# Patient Record
Sex: Female | Born: 2000 | Race: White | Hispanic: No | Marital: Single | Smoking: Never smoker
Health system: Southern US, Community
[De-identification: ages and names within clinical notes are randomized; demographics above are authoritative.]

## PROBLEM LIST (undated history)

## (undated) DIAGNOSIS — A749 Chlamydial infection, unspecified: Secondary | ICD-10-CM

## (undated) HISTORY — PX: TONSILLECTOMY: SUR1361

## (undated) HISTORY — DX: Chlamydial infection, unspecified: A74.9

## (undated) HISTORY — PX: NO PAST SURGERIES: SHX2092

---
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
## 2018-02-05 ENCOUNTER — Ambulatory Visit: Payer: Medicaid Other

## 2018-03-13 ENCOUNTER — Other Ambulatory Visit: Payer: Self-pay | Admitting: Obstetrics and Gynecology

## 2018-03-13 DIAGNOSIS — Z30011 Encounter for initial prescription of contraceptive pills: Secondary | ICD-10-CM

## 2018-03-14 DIAGNOSIS — A749 Chlamydial infection, unspecified: Secondary | ICD-10-CM

## 2018-03-14 HISTORY — DX: Chlamydial infection, unspecified: A74.9

## 2018-03-19 ENCOUNTER — Encounter: Payer: Self-pay | Admitting: Obstetrics and Gynecology

## 2018-03-19 ENCOUNTER — Ambulatory Visit (INDEPENDENT_AMBULATORY_CARE_PROVIDER_SITE_OTHER): Payer: Medicaid Other | Admitting: Obstetrics and Gynecology

## 2018-03-19 VITALS — BP 110/64 | HR 88 | Ht 63.0 in | Wt 112.0 lb

## 2018-03-19 DIAGNOSIS — B373 Candidiasis of vulva and vagina: Secondary | ICD-10-CM

## 2018-03-19 DIAGNOSIS — Z113 Encounter for screening for infections with a predominantly sexual mode of transmission: Secondary | ICD-10-CM

## 2018-03-19 DIAGNOSIS — B3731 Acute candidiasis of vulva and vagina: Secondary | ICD-10-CM

## 2018-03-19 LAB — POCT WET PREP WITH KOH
Clue Cells Wet Prep HPF POC: NEGATIVE
KOH Prep POC: NEGATIVE
Trichomonas, UA: NEGATIVE
YEAST WET PREP PER HPF POC: POSITIVE

## 2018-03-19 MED ORDER — TERCONAZOLE 0.8 % VA CREA: 1.0000 | TOPICAL_CREAM | Freq: Every day | VAGINAL | 0 refills | Status: AC

## 2018-03-19 NOTE — Progress Notes (Signed)
Stefanie Pinto, MD   Chief Complaint  Patient presents with  . Vaginitis    Itching x 1 month     HPI:      Stefanie Boone is a 17 y.o. G0P0000 who LMP was No LMP recorded. Patient has had an injection., presents today for vaginal itching without increased d/c, odor for the past month. Had "similar but different" sx 1/19 and was given diflucan but never took it then. Took it about 1 mo ago with vag itch sx without relief. No recent abx use. No LBP, belly pain, fevers. She is sex active with new partner. Uses olay soap, no dryer sheets, wears thongs.    History reviewed. No pertinent past medical history.  Past Surgical History:  Procedure Laterality Date  . NO PAST SURGERIES      Family History  Problem Relation Age of Onset  . Cervical cancer Mother   . Bladder Cancer Maternal Aunt   . Breast cancer Maternal Grandmother 71       pt's mom is BRCA neg 2012  . Bone cancer Maternal Grandmother   . Colon cancer Maternal Grandmother        great grandma    Social History   Socioeconomic History  . Marital status: Single    Spouse name: Not on file  . Number of children: Not on file  . Years of education: Not on file  . Highest education level: Not on file  Occupational History  . Not on file  Social Needs  . Financial resource strain: Not on file  . Food insecurity:    Worry: Not on file    Inability: Not on file  . Transportation needs:    Medical: Not on file    Non-medical: Not on file  Tobacco Use  . Smoking status: Never Smoker  . Smokeless tobacco: Never Used  Substance and Sexual Activity  . Alcohol use: No  . Drug use: No  . Sexual activity: Yes    Birth control/protection: Injection  Lifestyle  . Physical activity:    Days per week: Not on file    Minutes per session: Not on file  . Stress: Not on file  Relationships  . Social connections:    Talks on phone: Not on file    Gets together: Not on file    Attends religious service: Not on  file    Active member of club or organization: Not on file    Attends meetings of clubs or organizations: Not on file    Relationship status: Not on file  . Intimate partner violence:    Fear of current or ex partner: Not on file    Emotionally abused: Not on file    Physically abused: Not on file    Forced sexual activity: Not on file  Other Topics Concern  . Not on file  Social History Narrative  . Not on file    Outpatient Medications Prior to Visit  Medication Sig Dispense Refill  . BLISOVI 24 FE 1-20 MG-MCG(24) tablet TAKE 1 TABLET BY MOUTH EVERY DAY 84 tablet 0   No facility-administered medications prior to visit.       ROS:  Review of Systems  Constitutional: Negative for fever.  Gastrointestinal: Negative for blood in stool, constipation, diarrhea, nausea and vomiting.  Genitourinary: Negative for dyspareunia, dysuria, flank pain, frequency, hematuria, urgency, vaginal bleeding, vaginal discharge and vaginal pain.  Musculoskeletal: Negative for back pain.  Skin: Negative for rash.  OBJECTIVE:   Vitals:  BP 110/64   Pulse 88   Ht '5\' 3"'  (1.6 m)   Wt 112 lb (50.8 kg)   BMI 19.84 kg/m   Physical Exam  Constitutional: She is oriented to person, place, and time. Vital signs are normal. She appears well-developed.  Pulmonary/Chest: Effort normal.  Genitourinary: Vagina normal and uterus normal. There is tenderness on the right labia. There is no rash or lesion on the right labia. There is tenderness on the left labia. There is no rash or lesion on the left labia. Uterus is not enlarged and not tender. Cervix exhibits no motion tenderness. Right adnexum displays no mass and no tenderness. Left adnexum displays no mass and no tenderness. No erythema or tenderness in the vagina. No vaginal discharge found.  Musculoskeletal: Normal range of motion.  Neurological: She is alert and oriented to person, place, and time.  Psychiatric: She has a normal mood and affect. Her  behavior is normal. Thought content normal.  Vitals reviewed.   Results: Results for orders placed or performed in visit on 03/19/18 (from the past 24 hour(s))  POCT Wet Prep with KOH     Status: Abnormal   Collection Time: 03/19/18  3:15 PM  Result Value Ref Range   Trichomonas, UA Negative    Clue Cells Wet Prep HPF POC neg    Epithelial Wet Prep HPF POC  Few, Moderate, Many, Too numerous to count   Yeast Wet Prep HPF POC pos    Bacteria Wet Prep HPF POC  Few   RBC Wet Prep HPF POC     WBC Wet Prep HPF POC     KOH Prep POC Negative Negative     Assessment/Plan: Candidal vaginitis - Pos wet prep. Rx terazol3. F/u prn. Dove sens skin/d/c thongs.  - Plan: POCT Wet Prep with KOH, terconazole (TERAZOL 3) 0.8 % vaginal cream  Screening for STD (sexually transmitted disease) - Plan: Chlamydia/Gonococcus/Trichomonas, NAA    Meds ordered this encounter  Medications  . terconazole (TERAZOL 3) 0.8 % vaginal cream    Sig: Place 1 applicator vaginally at bedtime for 3 days.    Dispense:  20 g    Refill:  0    Order Specific Question:   Supervising Provider    Answer:   Gae Dry [[id number]]      Return if symptoms worsen or fail to improve.  Raysa Bosak B. Jaxten Brosh, PA-C 03/19/2018 3:16 PM

## 2018-03-19 NOTE — Patient Instructions (Signed)
I value your feedback and entrusting us with your care. If you get a [facility] patient survey, I would appreciate you taking the time to let us know about your experience today. Thank you! 

## 2018-03-21 ENCOUNTER — Telehealth: Payer: Self-pay | Admitting: Obstetrics and Gynecology

## 2018-03-21 ENCOUNTER — Other Ambulatory Visit: Payer: Self-pay | Admitting: Obstetrics and Gynecology

## 2018-03-21 DIAGNOSIS — A5609 Other chlamydial infection of lower genitourinary tract: Secondary | ICD-10-CM

## 2018-03-21 LAB — CHLAMYDIA/GONOCOCCUS/TRICHOMONAS, NAA
Chlamydia by NAA: POSITIVE — AB
Gonococcus by NAA: NEGATIVE
Trich vag by NAA: NEGATIVE

## 2018-03-21 MED ORDER — AZITHROMYCIN 500 MG PO TABS: 1000.0000 mg | ORAL_TABLET | Freq: Once | ORAL | 0 refills | Status: AC

## 2018-03-21 NOTE — Progress Notes (Signed)
Rx azithro for chlamydia.  Treated 4 months ago but never had TOC. RTO 7/19 for TOC.

## 2018-03-21 NOTE — Telephone Encounter (Signed)
Pt aware of chlamydia. Rx azithro. Partner needs tx. RTO 7/19 for TOC. Had it 4 months ago and did treatment but never had TOC. Condoms. RN to notify [facility].

## 2018-04-27 ENCOUNTER — Encounter: Payer: Self-pay | Admitting: Obstetrics and Gynecology

## 2018-04-27 ENCOUNTER — Ambulatory Visit (INDEPENDENT_AMBULATORY_CARE_PROVIDER_SITE_OTHER): Payer: Medicaid Other | Admitting: Obstetrics and Gynecology

## 2018-04-27 ENCOUNTER — Other Ambulatory Visit (HOSPITAL_COMMUNITY)
Admission: RE | Admit: 2018-04-27 | Discharge: 2018-04-27 | Disposition: A | Discharge status: Home / Self Care | Payer: Medicaid Other | Source: Ambulatory Visit | Attending: Obstetrics and Gynecology | Admitting: Obstetrics and Gynecology

## 2018-04-27 VITALS — BP 100/62 | HR 85 | Ht 63.0 in | Wt 112.5 lb

## 2018-04-27 DIAGNOSIS — Z0000 Encounter for general adult medical examination without abnormal findings: Secondary | ICD-10-CM | POA: Diagnosis not present

## 2018-04-27 DIAGNOSIS — A5609 Other chlamydial infection of lower genitourinary tract: Secondary | ICD-10-CM | POA: Insufficient documentation

## 2018-04-27 DIAGNOSIS — Z3041 Encounter for surveillance of contraceptive pills: Secondary | ICD-10-CM

## 2018-04-27 DIAGNOSIS — Z113 Encounter for screening for infections with a predominantly sexual mode of transmission: Secondary | ICD-10-CM

## 2018-04-27 DIAGNOSIS — Z01419 Encounter for gynecological examination (general) (routine) without abnormal findings: Secondary | ICD-10-CM

## 2018-04-27 MED ORDER — NORETHIN ACE-ETH ESTRAD-FE 1-20 MG-MCG(24) PO TABS: 1.0000 | ORAL_TABLET | Freq: Every day | ORAL | 3 refills | Status: DC

## 2018-04-27 NOTE — Progress Notes (Signed)
Chief Complaint  Patient presents with  . Gynecologic Exam     HPI:      [name] is a 17 y.o. G0P0000 who LMP was Patient's last menstrual period was 04/25/2018 (exact date)., presents today for her annual examinationHer menses are monthly, last 4 days. Dysmenorrhea mild, improved with NSAIDs. She does not have intermenstrual bleeding. Switched from depo to OCPs a few months ago and doing well.   Sex activity: sexually active, using OCPs.  Hx of STDs: chlamydia 6/19, took azithro, partner treated. No sx. Due for TOC today.  There is a FH of breast cancer in her MGM. Pt's mom is BRCA neg ~2012. There is no FH of ovarian cancer.   Tobacco use: The patient denies current or previous tobacco use. Alcohol use: none Exercise: moderately active  She does not get adequate calcium and Vitamin D in her diet.  She had 1 gard vaccine but then mom heard so many bad stories, she didn't get the final 2. Mom is still considering them.   Past Medical History:  Diagnosis Date  . Chlamydia 03/2018    Past Surgical History:  Procedure Laterality Date  . NO PAST SURGERIES      Family History  Problem Relation Age of Onset  . Cervical cancer Mother   . Bladder Cancer Maternal Aunt   . Breast cancer Maternal Grandmother 29       pt's mom is BRCA neg 2012  . Bone cancer Maternal Grandmother   . Colon cancer Maternal Grandmother        great grandma    Social History   Socioeconomic History  . Marital status: Single    Spouse name: Not on file  . Number of children: Not on file  . Years of education: Not on file  . Highest education level: Not on file  Occupational History  . Not on file  Social Needs  . Financial resource strain: Not on file  . Food insecurity:    Worry: Not on file    Inability: Not on file  . Transportation needs:    Medical: Not on file    Non-medical: Not on file  Tobacco Use  . Smoking status: Never Smoker  . Smokeless tobacco:  Never Used  Substance and Sexual Activity  . Alcohol use: No  . Drug use: No  . Sexual activity: Not Currently    Birth control/protection: Pill  Lifestyle  . Physical activity:    Days per week: Not on file    Minutes per session: Not on file  . Stress: Not on file  Relationships  . Social connections:    Talks on phone: Not on file    Gets together: Not on file    Attends religious service: Not on file    Active member of club or organization: Not on file    Attends meetings of clubs or organizations: Not on file    Relationship status: Not on file  . Intimate partner violence:    Fear of current or ex partner: Not on file    Emotionally abused: Not on file    Physically abused: Not on file    Forced sexual activity: Not on file  Other Topics Concern  . Not on file  Social History Narrative  . Not on file    No current outpatient medications on file prior to visit.   No current facility-administered medications on file prior to visit.  ROS:  Review of Systems  Constitutional: Negative for fatigue, fever and unexpected weight change.  Respiratory: Negative for cough, shortness of breath and wheezing.   Cardiovascular: Negative for chest pain, palpitations and leg swelling.  Gastrointestinal: Negative for blood in stool, constipation, diarrhea, nausea and vomiting.  Endocrine: Negative for cold intolerance, heat intolerance and polyuria.  Genitourinary: Negative for dyspareunia, dysuria, flank pain, frequency, genital sores, hematuria, menstrual problem, pelvic pain, urgency, vaginal bleeding, vaginal discharge and vaginal pain.  Musculoskeletal: Negative for back pain, joint swelling and myalgias.  Skin: Negative for rash.  Neurological: Negative for dizziness, syncope, light-headedness, numbness and headaches.  Hematological: Negative for adenopathy.  Psychiatric/Behavioral: Negative for agitation, confusion, sleep disturbance and suicidal ideas. The patient is  not nervous/anxious.      Objective: BP 100/62   Pulse 85   Ht '5\' 3"'  (1.6 m)   Wt 112 lb 8 oz (51 kg)   LMP 04/25/2018 (Exact Date)   BMI 19.93 kg/m    Physical Exam  Constitutional: She is oriented to person, place, and time. She appears well-developed and well-nourished.  Genitourinary: Vagina normal and uterus normal. There is no rash or tenderness on the right labia. There is no rash or tenderness on the left labia. No erythema or tenderness in the vagina. No vaginal discharge found. Right adnexum does not display mass and does not display tenderness. Left adnexum does not display mass and does not display tenderness. Cervix does not exhibit motion tenderness or polyp. Uterus is not enlarged or tender.  Neck: Normal range of motion. No thyromegaly present.  Cardiovascular: Normal rate, regular rhythm and normal heart sounds.  No murmur heard. Pulmonary/Chest: Effort normal and breath sounds normal. Right breast exhibits no mass, no nipple discharge, no skin change and no tenderness. Left breast exhibits no mass, no nipple discharge, no skin change and no tenderness.  Abdominal: Soft. There is no tenderness. There is no guarding.  Musculoskeletal: Normal range of motion.  Neurological: She is alert and oriented to person, place, and time. No cranial nerve deficit.  Psychiatric: She has a normal mood and affect. Her behavior is normal.  Vitals reviewed.   Assessment/Plan: Encounter for annual routine gynecological examination  Screening for STD (sexually transmitted disease) - Plan: Cervicovaginal ancillary only  Chlamydial cervicitis - TOC today. Will call with results.  - Plan: Cervicovaginal ancillary only  Encounter for surveillance of contraceptive pills - OCP RF.  - Plan: Norethindrone Acetate-Ethinyl Estrad-FE (BLISOVI 24 FE) 1-20 MG-MCG(24) tablet  Meds ordered this encounter  Medications  . Norethindrone Acetate-Ethinyl Estrad-FE (BLISOVI 24 FE) 1-20 MG-MCG(24)  tablet    Sig: Take 1 tablet by mouth daily.    Dispense:  84 tablet    Refill:  3    Order Specific Question:   Supervising Provider    Answer:   Gae Dry [[id number]]             GYN counsel STD prevention, adequate intake of calcium and vitamin D, diet and exercise     F/U  Return in about 1 year (around 04/28/2019).  Alicia B. Copland, PA-C 04/27/2018 10:38 AM

## 2018-04-27 NOTE — Patient Instructions (Signed)
I value your feedback and entrusting us with your care. If you get a Stefanie Boone patient survey, I would appreciate you taking the time to let us know about your experience today. Thank you! 

## 2018-04-28 LAB — CERVICOVAGINAL ANCILLARY ONLY
Chlamydia: NEGATIVE
Neisseria Gonorrhea: NEGATIVE
Trichomonas: NEGATIVE

## 2018-04-29 ENCOUNTER — Telehealth: Payer: Self-pay | Admitting: Obstetrics and Gynecology

## 2018-04-29 NOTE — Telephone Encounter (Signed)
Patient is calling for labs results. Please advise. 

## 2018-04-29 NOTE — Telephone Encounter (Signed)
Pt aware.

## 2018-05-04 ENCOUNTER — Encounter: Payer: Self-pay | Admitting: Emergency Medicine

## 2018-05-04 ENCOUNTER — Other Ambulatory Visit: Payer: Self-pay

## 2018-05-04 ENCOUNTER — Emergency Department
Admission: EM | Admit: 2018-05-04 | Discharge: 2018-05-04 | Disposition: A | Discharge status: Home / Self Care | Payer: Medicaid Other | Attending: Emergency Medicine | Admitting: Emergency Medicine

## 2018-05-04 DIAGNOSIS — X16XXXA Contact with hot heating appliances, radiators and pipes, initial encounter: Secondary | ICD-10-CM | POA: Insufficient documentation

## 2018-05-04 DIAGNOSIS — Y939 Activity, unspecified: Secondary | ICD-10-CM | POA: Insufficient documentation

## 2018-05-04 DIAGNOSIS — T24212A Burn of second degree of left thigh, initial encounter: Secondary | ICD-10-CM | POA: Insufficient documentation

## 2018-05-04 DIAGNOSIS — Z79899 Other long term (current) drug therapy: Secondary | ICD-10-CM | POA: Insufficient documentation

## 2018-05-04 DIAGNOSIS — Y929 Unspecified place or not applicable: Secondary | ICD-10-CM | POA: Insufficient documentation

## 2018-05-04 DIAGNOSIS — Y999 Unspecified external cause status: Secondary | ICD-10-CM | POA: Insufficient documentation

## 2018-05-04 NOTE — Discharge Instructions (Signed)
Aloe up with your pediatrician if any continued problems.  You may take Tylenol or ibuprofen as needed for pain.  Keep area clean and dry.  Once the blister has opened watch for any signs of infection and continue cleaning the area daily with mild soap and water.  Should the area become red and angry looking or if you are having drainage from the area that looks like pus you should be seen by medical provider for treatment.  Return to the emergency department if the doctor's office is closed.

## 2018-05-04 NOTE — ED Notes (Signed)
Pt has blistered burn on L thigh, (see physical diagram) Pt states she dropped her curling iron a few hours ago, denies pain.

## 2018-05-04 NOTE — ED Notes (Signed)
Dressing applied to burn.

## 2018-05-04 NOTE — ED Triage Notes (Signed)
Burn left leg with curling iron on Saturday.

## 2018-05-04 NOTE — ED Provider Notes (Signed)
[facility] Emergency Department Provider Note  ____________________________________________   First MD Initiated Contact with Patient 05/04/18 1139     (approximate)  I have reviewed the triage vital signs and the nursing notes.   HISTORY  Chief Complaint Burn  HPI Stefanie Boone is a 17 y.o. female is here with complaint of burn to her left thigh.  Patient states that she was sitting on the bed 2 days ago when she accidentally set her curling iron on her leg.  Mother states that she called this morning to make an appointment with her pediatrician who is already booked for the day.  She was told to come to the emergency department for evaluation.  Patient is up-to-date on immunizations.  Patient rates her pain as 0/10.   Past Medical History:  Diagnosis Date  . Chlamydia 03/2018    Patient Active Problem List   Diagnosis Date Noted  . Chlamydia 03/14/2018    Past Surgical History:  Procedure Laterality Date  . NO PAST SURGERIES    . TONSILLECTOMY      Prior to Admission medications   Medication Sig Start Date End Date Taking? Authorizing Provider  Norethindrone Acetate-Ethinyl Estrad-FE (BLISOVI 24 FE) 1-20 MG-MCG(24) tablet Take 1 tablet by mouth daily. 1/61/09   Copland, Deirdre Evener, PA-C    Allergies Patient has no known allergies.  Family History  Problem Relation Age of Onset  . Cervical cancer Mother   . Bladder Cancer Maternal Aunt   . Breast cancer Maternal Grandmother 47       pt's mom is BRCA neg 2012  . Bone cancer Maternal Grandmother   . Colon cancer Maternal Grandmother        great grandma    Social History Social History   Tobacco Use  . Smoking status: Never Smoker  . Smokeless tobacco: Never Used  Substance Use Topics  . Alcohol use: No  . Drug use: No    Review of Systems Constitutional: No fever/chills Respiratory: Denies shortness of breath. Musculoskeletal: Positive burn left thigh. Skin: Positive for  burn. Neurological: Negative for headaches, focal weakness or numbness. ____________________________________________   PHYSICAL EXAM:  VITAL SIGNS: ED Triage Vitals  Enc Vitals Group     BP 05/04/18 1133 122/80     Pulse Rate 05/04/18 1133 72     Resp 05/04/18 1133 14     Temp 05/04/18 1133 98.4 F (36.9 C)     Temp Source 05/04/18 1133 Oral     SpO2 05/04/18 1133 100 %     Weight 05/04/18 1128 112 lb (50.8 kg)     Height 05/04/18 1128 '5\' 3"'  (1.6 m)     Head Circumference --      Peak Flow --      Pain Score 05/04/18 1128 0     Pain Loc --      Pain Edu? --      Excl. in [place]? --    Constitutional: Alert and oriented. Well appearing and in no acute distress. Eyes: Conjunctivae are normal.  Head: Atraumatic. Neck: No stridor.   Cardiovascular: Normal rate, regular rhythm. Grossly normal heart sounds.  Good peripheral circulation. Respiratory: Normal respiratory effort.  No retractions. Lungs CTAB. Musculoskeletal: No lower extremity tenderness nor edema.  No joint effusions. Neurologic:  Normal speech and language. No gross focal neurologic deficits are appreciated. No gait instability. Skin:  Skin is warm, dry.  2.5 to 3 cm intact blister formation on the anterior thigh.  No evidence of infection at this time.  No drainage is noted. Psychiatric: Mood and affect are normal. Speech and behavior are normal.  ____________________________________________   LABS (all labs ordered are listed, but only abnormal results are displayed)  Labs Reviewed - No data to display    PROCEDURES  Procedure(s) performed: None  Procedures  Critical Care performed: No  ____________________________________________   INITIAL IMPRESSION / ASSESSMENT AND PLAN / ED COURSE  As part of my medical decision making, I reviewed the following data within the electronic MEDICAL RECORD NUMBER Notes from prior ED visits and [place] Controlled Substance Database  Dry bulky dressing was placed in the area.   Patient was given instructions on how to care for her burn.  She is also encouraged to take Tylenol or ibuprofen if needed for pain.  She will follow-up with her PCP or return to the emergency department if any signs or concerns for infection. ____________________________________________   FINAL CLINICAL IMPRESSION(S) / ED DIAGNOSES  Final diagnoses:  Second degree burn of left thigh, initial encounter     ED Discharge Orders    None       Note:  This document was prepared using Dragon voice recognition software and may include unintentional dictation errors.    Johnn Hai, PA-C 05/04/18 1523    Nance Pear, MD 05/05/18 [phone]

## 2018-05-06 ENCOUNTER — Other Ambulatory Visit (HOSPITAL_COMMUNITY)
Admission: RE | Admit: 2018-05-06 | Discharge: 2018-05-06 | Disposition: A | Discharge status: Home / Self Care | Payer: Medicaid Other | Source: Ambulatory Visit | Attending: Obstetrics and Gynecology | Admitting: Obstetrics and Gynecology

## 2018-05-06 ENCOUNTER — Encounter: Payer: Self-pay | Admitting: Obstetrics and Gynecology

## 2018-05-06 ENCOUNTER — Ambulatory Visit (INDEPENDENT_AMBULATORY_CARE_PROVIDER_SITE_OTHER): Payer: Medicaid Other | Admitting: Obstetrics and Gynecology

## 2018-05-06 VITALS — BP 112/70 | HR 94 | Ht 63.0 in | Wt 112.0 lb

## 2018-05-06 DIAGNOSIS — Z113 Encounter for screening for infections with a predominantly sexual mode of transmission: Secondary | ICD-10-CM | POA: Insufficient documentation

## 2018-05-06 DIAGNOSIS — B3731 Acute candidiasis of vulva and vagina: Secondary | ICD-10-CM

## 2018-05-06 DIAGNOSIS — B373 Candidiasis of vulva and vagina: Secondary | ICD-10-CM | POA: Diagnosis not present

## 2018-05-06 LAB — POCT WET PREP WITH KOH
Clue Cells Wet Prep HPF POC: NEGATIVE
KOH Prep POC: NEGATIVE
TRICHOMONAS UA: NEGATIVE

## 2018-05-06 MED ORDER — FLUCONAZOLE 150 MG PO TABS: 150.0000 mg | ORAL_TABLET | Freq: Once | ORAL | 0 refills | Status: AC

## 2018-05-06 NOTE — Patient Instructions (Signed)
I value your feedback and entrusting us with your care. If you get a [facility] patient survey, I would appreciate you taking the time to let us know about your experience today. Thank you! 

## 2018-05-06 NOTE — Progress Notes (Signed)
Stefanie Pinto, MD   Chief Complaint  Patient presents with  . Vaginitis    Vaginal itching x3 days     HPI:      [name] is a 17 y.o. G0P0000 who LMP was Patient's last menstrual period was 04/25/2018 (exact date)., presents today for vaginal itching the past few days without increased d/c, odor. Sx started earlier and treated with leftover terazol-3 ext with some relief, but then sx recurred. Treated for yeast vag 03/19/18 with terazol 3. Treated with abx a couple days later for chlamydia. Uses dove soap, no dryer sheets. Mom has started new fabric softener in wash.  Pt is sex active, using condoms and OCPs. Condom broke a couple days ago. No new partners but did have chlamydia 6/19 with this partner. Neg TOC 04/27/18. Annual done 04/27/18   Past Medical History:  Diagnosis Date  . Chlamydia 03/2018    Past Surgical History:  Procedure Laterality Date  . NO PAST SURGERIES    . TONSILLECTOMY      Family History  Problem Relation Age of Onset  . Cervical cancer Mother   . Bladder Cancer Maternal Aunt   . Breast cancer Maternal Grandmother 81       pt's mom is BRCA neg 2012  . Bone cancer Maternal Grandmother   . Colon cancer Maternal Grandmother        great grandma    Social History   Socioeconomic History  . Marital status: Single    Spouse name: Not on file  . Number of children: Not on file  . Years of education: Not on file  . Highest education level: Not on file  Occupational History  . Not on file  Social Needs  . Financial resource strain: Not on file  . Food insecurity:    Worry: Not on file    Inability: Not on file  . Transportation needs:    Medical: Not on file    Non-medical: Not on file  Tobacco Use  . Smoking status: Never Smoker  . Smokeless tobacco: Never Used  Substance and Sexual Activity  . Alcohol use: No  . Drug use: No  . Sexual activity: Yes    Birth control/protection: Pill  Lifestyle  . Physical activity:    Days  per week: Not on file    Minutes per session: Not on file  . Stress: Not on file  Relationships  . Social connections:    Talks on phone: Not on file    Gets together: Not on file    Attends religious service: Not on file    Active member of club or organization: Not on file    Attends meetings of clubs or organizations: Not on file    Relationship status: Not on file  . Intimate partner violence:    Fear of current or ex partner: Not on file    Emotionally abused: Not on file    Physically abused: Not on file    Forced sexual activity: Not on file  Other Topics Concern  . Not on file  Social History Narrative  . Not on file    Outpatient Medications Prior to Visit  Medication Sig Dispense Refill  . Norethindrone Acetate-Ethinyl Estrad-FE (BLISOVI 24 FE) 1-20 MG-MCG(24) tablet Take 1 tablet by mouth daily. 84 tablet 3   No facility-administered medications prior to visit.     ROS:  Review of Systems  Constitutional: Negative for fever.  Gastrointestinal: Negative for  blood in stool, constipation, diarrhea, nausea and vomiting.  Genitourinary: Negative for dyspareunia, dysuria, flank pain, frequency, hematuria, urgency, vaginal bleeding, vaginal discharge and vaginal pain.  Musculoskeletal: Negative for back pain.  Skin: Negative for rash.   BREAST: No symptoms   OBJECTIVE:   Vitals:  BP 112/70   Pulse 94   Ht '5\' 3"'  (1.6 m)   Wt 112 lb (50.8 kg)   LMP 04/25/2018 (Exact Date)   BMI 19.84 kg/m   Physical Exam  Constitutional: She is oriented to person, place, and time. Vital signs are normal. She appears well-developed.  Pulmonary/Chest: Effort normal.  Genitourinary: Vagina normal. There is rash on the right labia. There is no tenderness or lesion on the right labia. There is rash on the left labia. There is no tenderness or lesion on the left labia. Cervix exhibits no motion tenderness, no discharge and no friability. No erythema or tenderness in the vagina. No  vaginal discharge found.  Musculoskeletal: Normal range of motion.  Neurological: She is alert and oriented to person, place, and time.  Psychiatric: She has a normal mood and affect. Her behavior is normal. Thought content normal.  Vitals reviewed.   Results: Results for orders placed or performed in visit on 05/06/18 (from the past 24 hour(s))  POCT Wet Prep with KOH     Status: Abnormal   Collection Time: 05/06/18  3:20 PM  Result Value Ref Range   Trichomonas, UA Negative    Clue Cells Wet Prep HPF POC neg    Epithelial Wet Prep HPF POC  Few, Moderate, Many, Too numerous to count   Yeast Wet Prep HPF POC few    Bacteria Wet Prep HPF POC  Few   RBC Wet Prep HPF POC     WBC Wet Prep HPF POC     KOH Prep POC Negative Negative     Assessment/Plan: Candidal vaginitis - Pos exam/wet prep. Rx diflucan since going to the beach. F/u prn.  - Plan: POCT Wet Prep with KOH, fluconazole (DIFLUCAN) 150 MG tablet  Screening for STD (sexually transmitted disease) - REchk today due to broken condom. Will call with results - Plan: Cervicovaginal ancillary only   Meds ordered this encounter  Medications  . fluconazole (DIFLUCAN) 150 MG tablet    Sig: Take 1 tablet (150 mg total) by mouth once for 1 dose.    Dispense:  1 tablet    Refill:  0    Order Specific Question:   Supervising Provider    Answer:   Gae Dry [[id number]]      Return if symptoms worsen or fail to improve.  Shyla Gayheart B. Ezrah Panning, PA-C 05/06/2018 3:22 PM

## 2018-05-08 LAB — CERVICOVAGINAL ANCILLARY ONLY
CHLAMYDIA, DNA PROBE: NEGATIVE
NEISSERIA GONORRHEA: NEGATIVE
TRICH (WINDOWPATH): NEGATIVE

## 2018-07-28 ENCOUNTER — Encounter: Payer: Self-pay | Admitting: Obstetrics and Gynecology

## 2018-07-28 ENCOUNTER — Ambulatory Visit (INDEPENDENT_AMBULATORY_CARE_PROVIDER_SITE_OTHER): Payer: Medicaid Other | Admitting: Obstetrics and Gynecology

## 2018-07-28 VITALS — BP 100/64 | HR 64 | Ht 63.0 in | Wt 113.0 lb

## 2018-07-28 DIAGNOSIS — B9689 Other specified bacterial agents as the cause of diseases classified elsewhere: Secondary | ICD-10-CM

## 2018-07-28 DIAGNOSIS — N760 Acute vaginitis: Secondary | ICD-10-CM

## 2018-07-28 LAB — POCT WET PREP WITH KOH
Clue Cells Wet Prep HPF POC: POSITIVE
KOH Prep POC: POSITIVE — AB
Trichomonas, UA: NEGATIVE
Yeast Wet Prep HPF POC: NEGATIVE

## 2018-07-28 MED ORDER — METRONIDAZOLE 500 MG PO TABS: ORAL_TABLET | ORAL | 0 refills | Status: DC

## 2018-07-28 NOTE — Patient Instructions (Signed)
I value your feedback and entrusting us with your care. If you get a [facility] patient survey, I would appreciate you taking the time to let us know about your experience today. Thank you! 

## 2018-07-28 NOTE — Progress Notes (Signed)
Erma Pinto, MD   Chief Complaint  Patient presents with  . vaginal odor    x 3 days, no discharge or itchiness    HPI:      [name] is a 17 y.o. G0P0000 who LMP was No LMP recorded. (Menstrual status: Oral contraceptives)., presents today for vaginal odor without increased d/c/irritation for the past 3 days. Had yeast vag 7/19 and treated with diflucan with relief. Pt has used monistat a couple times since for vag irritation. Uses dove sens skin soap and mom is now using dryer sheets. No urin sx, LBP, belly pain, fevers.  Not currently sex active. No partners since neg STD testing 7/19. No Hx of chlamydia in past. Last annual 7/19.   Past Medical History:  Diagnosis Date  . Chlamydia 03/2018    Past Surgical History:  Procedure Laterality Date  . NO PAST SURGERIES    . TONSILLECTOMY      Family History  Problem Relation Age of Onset  . Cervical cancer Mother   . Bladder Cancer Maternal Aunt   . Breast cancer Maternal Grandmother 37       pt's mom is BRCA neg 2012  . Bone cancer Maternal Grandmother   . Colon cancer Maternal Grandmother        great grandma    Social History   Socioeconomic History  . Marital status: Single    Spouse name: Not on file  . Number of children: Not on file  . Years of education: Not on file  . Highest education level: Not on file  Occupational History  . Not on file  Social Needs  . Financial resource strain: Not on file  . Food insecurity:    Worry: Not on file    Inability: Not on file  . Transportation needs:    Medical: Not on file    Non-medical: Not on file  Tobacco Use  . Smoking status: Never Smoker  . Smokeless tobacco: Never Used  Substance and Sexual Activity  . Alcohol use: No  . Drug use: No  . Sexual activity: Not Currently    Birth control/protection: Pill  Lifestyle  . Physical activity:    Days per week: Not on file    Minutes per session: Not on file  . Stress: Not on file    Relationships  . Social connections:    Talks on phone: Not on file    Gets together: Not on file    Attends religious service: Not on file    Active member of club or organization: Not on file    Attends meetings of clubs or organizations: Not on file    Relationship status: Not on file  . Intimate partner violence:    Fear of current or ex partner: Not on file    Emotionally abused: Not on file    Physically abused: Not on file    Forced sexual activity: Not on file  Other Topics Concern  . Not on file  Social History Narrative  . Not on file    Outpatient Medications Prior to Visit  Medication Sig Dispense Refill  . Norethindrone Acetate-Ethinyl Estrad-FE (BLISOVI 24 FE) 1-20 MG-MCG(24) tablet Take 1 tablet by mouth daily. 84 tablet 3   No facility-administered medications prior to visit.     ROS:  Review of Systems  Constitutional: Negative for fever.  Gastrointestinal: Negative for blood in stool, constipation, diarrhea, nausea and vomiting.  Genitourinary: Negative for dyspareunia, dysuria,  flank pain, frequency, hematuria, urgency, vaginal bleeding, vaginal discharge and vaginal pain.  Musculoskeletal: Negative for back pain.  Skin: Negative for rash.    OBJECTIVE:   Vitals:  BP 100/64   Pulse 64   Ht 5' 3" (1.6 m)   Wt 113 lb (51.3 kg)   BMI 20.02 kg/m   Physical Exam  Constitutional: She is oriented to person, place, and time. Vital signs are normal. She appears well-developed.  Pulmonary/Chest: Effort normal.  Genitourinary: Uterus normal. There is no rash, tenderness or lesion on the right labia. There is no rash, tenderness or lesion on the left labia. Uterus is not enlarged and not tender. Cervix exhibits no motion tenderness. Right adnexum displays no mass and no tenderness. Left adnexum displays no mass and no tenderness. There is bleeding in the vagina. No erythema or tenderness in the vagina. No vaginal discharge found.  Musculoskeletal: Normal  range of motion.  Neurological: She is alert and oriented to person, place, and time.  Psychiatric: She has a normal mood and affect. Her behavior is normal. Thought content normal.  Vitals reviewed.   Results: Results for orders placed or performed in visit on 07/28/18 (from the past 24 hour(s))  POCT Wet Prep with KOH     Status: Abnormal   Collection Time: 07/28/18  9:14 AM  Result Value Ref Range   Trichomonas, UA Negative    Clue Cells Wet Prep HPF POC pos    Epithelial Wet Prep HPF POC     Yeast Wet Prep HPF POC neg    Bacteria Wet Prep HPF POC     RBC Wet Prep HPF POC     WBC Wet Prep HPF POC     KOH Prep POC Positive (A) Negative     Assessment/Plan: Bacterial vaginosis - Pos wet prep. Rx flagyl. No EtOH. Will RF if sx recur. F/u prn. No dryer sheets/line dry underwear - Plan: POCT Wet Prep with KOH, metroNIDAZOLE (FLAGYL) 500 MG tablet    Meds ordered this encounter  Medications  . metroNIDAZOLE (FLAGYL) 500 MG tablet    Sig: Take 2 tabs BID for 1 day    Dispense:  4 tablet    Refill:  0    Order Specific Question:   Supervising Provider    Answer:   HARRIS, ROBERT P [[id number]]      Return if symptoms worsen or fail to improve.   B. , PA-C 07/28/2018 9:19 AM      

## 2019-03-10 ENCOUNTER — Other Ambulatory Visit (HOSPITAL_COMMUNITY)
Admission: RE | Admit: 2019-03-10 | Discharge: 2019-03-10 | Disposition: A | Discharge status: Home / Self Care | Payer: Medicaid Other | Source: Ambulatory Visit | Attending: Certified Nurse Midwife | Admitting: Certified Nurse Midwife

## 2019-03-10 ENCOUNTER — Other Ambulatory Visit: Payer: Self-pay

## 2019-03-10 ENCOUNTER — Encounter: Payer: Self-pay | Admitting: Certified Nurse Midwife

## 2019-03-10 ENCOUNTER — Ambulatory Visit (INDEPENDENT_AMBULATORY_CARE_PROVIDER_SITE_OTHER): Payer: Medicaid Other | Admitting: Certified Nurse Midwife

## 2019-03-10 VITALS — BP 102/60 | HR 72 | Ht 63.0 in | Wt 118.0 lb

## 2019-03-10 DIAGNOSIS — Z113 Encounter for screening for infections with a predominantly sexual mode of transmission: Secondary | ICD-10-CM

## 2019-03-10 DIAGNOSIS — N921 Excessive and frequent menstruation with irregular cycle: Secondary | ICD-10-CM | POA: Insufficient documentation

## 2019-03-10 DIAGNOSIS — N898 Other specified noninflammatory disorders of vagina: Secondary | ICD-10-CM | POA: Insufficient documentation

## 2019-03-10 DIAGNOSIS — Z3202 Encounter for pregnancy test, result negative: Secondary | ICD-10-CM

## 2019-03-10 NOTE — Progress Notes (Signed)
Obstetrics & Gynecology Office Visit   Chief Complaint:  Chief Complaint  Patient presents with  . Vaginitis    Would like STD testing. Pt experiancing diacharge that is greenish brown in color, no odor x3 weeks    History of Present Illness: 18 year old G43 female, LMP 02/10/2019, who presents with complaints of an intermittent "vaginal discharge" that has occurred once a week for the last 3 weeks. The last time was yesterday. She has taken a picture of the discharge which appears to be brown bloody discharge. She denies odor, vulvar itching or dysuria. She is currently taking OCPs (Blisovi 24) but reports forgetting to take pills since she has a new job. She has been with her current sex partner x 8 months. Treated for yeast, BV and Chlamydia this past year.   Review of Systems:  ROS -see HPI  Past Medical History:  Past Medical History:  Diagnosis Date  . Chlamydia 03/2018    Past Surgical History:  Past Surgical History:  Procedure Laterality Date  . TONSILLECTOMY      Gynecologic History: Patient's last menstrual period was 02/10/2019 (approximate).  Obstetric History: G0P0000  Family History:  Family History  Problem Relation Age of Onset  . Cervical cancer Mother   . Bladder Cancer Maternal Aunt   . Breast cancer Maternal Grandmother 77       pt's mom is BRCA neg 2012  . Bone cancer Maternal Grandmother   . Colon cancer Maternal Grandmother        great grandma    Social History:  Social History   Socioeconomic History  . Marital status: Single    Spouse name: Not on file  . Number of children: Not on file  . Years of education: Not on file  . Highest education level: Not on file  Occupational History  . Not on file  Social Needs  . Financial resource strain: Not on file  . Food insecurity:    Worry: Not on file    Inability: Not on file  . Transportation needs:    Medical: Not on file    Non-medical: Not on file  Tobacco Use  . Smoking status:  Never Smoker  . Smokeless tobacco: Never Used  Substance and Sexual Activity  . Alcohol use: No  . Drug use: No  . Sexual activity: Yes    Birth control/protection: Pill  Lifestyle  . Physical activity:    Days per week: Not on file    Minutes per session: Not on file  . Stress: Not on file  Relationships  . Social connections:    Talks on phone: Not on file    Gets together: Not on file    Attends religious service: Not on file    Active member of club or organization: Not on file    Attends meetings of clubs or organizations: Not on file    Relationship status: Not on file  . Intimate partner violence:    Fear of current or ex partner: Not on file    Emotionally abused: Not on file    Physically abused: Not on file    Forced sexual activity: Not on file  Other Topics Concern  . Not on file  Social History Narrative  . Not on file    Allergies:  No Known Allergies  Medications: Prior to Admission medications   Medication Sig Start Date End Date Taking? Authorizing Provider  Norethindrone Acetate-Ethinyl Estrad-FE (BLISOVI 24 FE) 1-20 MG-MCG(24)  tablet Take 1 tablet by mouth daily. 2/29/79  Yes Copland, Deirdre Evener, PA-C    Physical Exam Vitals: BP 102/60   Pulse 72   Ht _0  (1.6 m)   Wt 118 lb (53.5 kg)   LMP 02/10/2019 (Approximate)   BMI 20.90 kg/m    Physical Exam  Constitutional: She is oriented to person, place, and time. She appears well-developed and well-nourished. No distress.  Cardiovascular: Normal rate.  Respiratory: Effort normal.  Genitourinary:    Genitourinary Comments: Vulva: no lesions or inflammation Vagina: no lesions, white discharge Cervix: closed, no lesions Uterus: RV,NSSC, NT Adnexa: no masses   Musculoskeletal: Normal range of motion.  Neurological: She is alert and oriented to person, place, and time.  Skin: Skin is warm and dry.   Results for orders placed or performed in visit on 03/10/19 (from the past 24 hour(s))  POCT Wet  Prep [facility] Forth [place])     Status: Normal   Collection Time: 03/11/19  6:29 AM  Result Value Ref Range   Source Wet Prep POC vagina    WBC, Wet Prep HPF POC     Bacteria Wet Prep HPF POC Few Few   BACTERIA WET PREP MORPHOLOGY POC     Clue Cells Wet Prep HPF POC None None   Clue Cells Wet Prep Whiff POC     Yeast Wet Prep HPF POC None None   KOH Wet Prep POC     Trichomonas Wet Prep HPF POC Absent Absent  POCT urine pregnancy     Status: Normal   Collection Time: 03/11/19  6:30 AM  Result Value Ref Range   Preg Test, Ur Negative Negative   Urine pregnancy test was negative  Assessment: 18 y.o. G0P0000 with probable BTB on OCPs due to missing pills R/O STD  Plan: Aptima sent Discussed timing of OCP. Encouraged to take pills when she gets home from work and before going to bed. Set alarm to help her remember.  If BTB persists if taking pills correctly, add estrogen. If still forgetting to take pills, discuss different method of birth control at time of next annual  [place], [place].

## 2019-03-11 ENCOUNTER — Encounter: Payer: Self-pay | Admitting: Certified Nurse Midwife

## 2019-03-11 LAB — POCT WET PREP (WET MOUNT): Trichomonas Wet Prep HPF POC: ABSENT

## 2019-03-11 LAB — POCT URINE PREGNANCY: Preg Test, Ur: NEGATIVE

## 2019-03-12 LAB — CERVICOVAGINAL ANCILLARY ONLY
Chlamydia: NEGATIVE
Neisseria Gonorrhea: NEGATIVE

## 2019-04-28 NOTE — Progress Notes (Deleted)
No chief complaint on file.    HPI:      [name] is a 18 y.o. G0P0000 who LMP was No LMP recorded. (Menstrual status: Oral contraceptives)., presents today for her annual examinationHer menses are monthly, last 4 days. Dysmenorrhea mild, improved with NSAIDs. She does not have intermenstrual bleeding. Switched from depo to OCPs a few months ago and doing well.   Sex activity: sexually active, using OCPs.  Hx of STDs: chlamydia 6/19, took azithro, partner treated. Neg TOC 7/19.  There is a FH of breast cancer in her Stefanie Boone. Pt's mom is BRCA neg ~2012. There is no FH of ovarian cancer.   Tobacco use: The patient denies current or previous tobacco use. Alcohol use: none Exercise: moderately active  She does not get adequate calcium and Vitamin D in her diet.  She had 1 gard vaccine but then mom heard so many bad stories, she didn't get the final 2. Mom is still considering them.   Hx of BV and yeast past yr ***  Past Medical History:  Diagnosis Date  . Chlamydia 03/2018    Past Surgical History:  Procedure Laterality Date  . TONSILLECTOMY      Family History  Problem Relation Age of Onset  . Cervical cancer Mother   . Bladder Cancer Maternal Aunt   . Breast cancer Maternal Grandmother 52       pt's mom is BRCA neg 2012  . Bone cancer Maternal Grandmother   . Colon cancer Maternal Grandmother        great grandma    Social History   Socioeconomic History  . Marital status: Single    Spouse name: Not on file  . Number of children: Not on file  . Years of education: Not on file  . Highest education level: Not on file  Occupational History  . Not on file  Social Needs  . Financial resource strain: Not on file  . Food insecurity    Worry: Not on file    Inability: Not on file  . Transportation needs    Medical: Not on file    Non-medical: Not on file  Tobacco Use  . Smoking status: Never Smoker  . Smokeless tobacco: Never Used  Substance  and Sexual Activity  . Alcohol use: No  . Drug use: No  . Sexual activity: Yes    Birth control/protection: Pill  Lifestyle  . Physical activity    Days per week: Not on file    Minutes per session: Not on file  . Stress: Not on file  Relationships  . Social Herbalist on phone: Not on file    Gets together: Not on file    Attends religious service: Not on file    Active member of club or organization: Not on file    Attends meetings of clubs or organizations: Not on file    Relationship status: Not on file  . Intimate partner violence    Fear of current or ex partner: Not on file    Emotionally abused: Not on file    Physically abused: Not on file    Forced sexual activity: Not on file  Other Topics Concern  . Not on file  Social History Narrative  . Not on file    Current Outpatient Medications on File Prior to Visit  Medication Sig Dispense Refill  . Norethindrone Acetate-Ethinyl Estrad-FE (BLISOVI 24 FE) 1-20 MG-MCG(24) tablet Take 1 tablet by  mouth daily. 84 tablet 3   No current facility-administered medications on file prior to visit.     ROS:  Review of Systems  Constitutional: Negative for fatigue, fever and unexpected weight change.  Respiratory: Negative for cough, shortness of breath and wheezing.   Cardiovascular: Negative for chest pain, palpitations and leg swelling.  Gastrointestinal: Negative for blood in stool, constipation, diarrhea, nausea and vomiting.  Endocrine: Negative for cold intolerance, heat intolerance and polyuria.  Genitourinary: Negative for dyspareunia, dysuria, flank pain, frequency, genital sores, hematuria, menstrual problem, pelvic pain, urgency, vaginal bleeding, vaginal discharge and vaginal pain.  Musculoskeletal: Negative for back pain, joint swelling and myalgias.  Skin: Negative for rash.  Neurological: Negative for dizziness, syncope, light-headedness, numbness and headaches.  Hematological: Negative for  adenopathy.  Psychiatric/Behavioral: Negative for agitation, confusion, sleep disturbance and suicidal ideas. The patient is not nervous/anxious.      Objective: There were no vitals taken for this visit.   Physical Exam Constitutional:      Appearance: She is well-developed.  Genitourinary:     Vagina and uterus normal.     No vaginal discharge, erythema or tenderness.     No cervical motion tenderness or polyp.     Uterus is not enlarged or tender.     No right or left adnexal mass present.     Right adnexa not tender.     Left adnexa not tender.  Neck:     Musculoskeletal: Normal range of motion.     Thyroid: No thyromegaly.  Cardiovascular:     Rate and Rhythm: Normal rate and regular rhythm.     Heart sounds: Normal heart sounds. No murmur.  Pulmonary:     Effort: Pulmonary effort is normal.     Breath sounds: Normal breath sounds.  Chest:     Breasts:        Right: No mass, nipple discharge, skin change or tenderness.        Left: No mass, nipple discharge, skin change or tenderness.  Abdominal:     Palpations: Abdomen is soft.     Tenderness: There is no abdominal tenderness. There is no guarding.  Musculoskeletal: Normal range of motion.  Neurological:     Mental Status: She is alert and oriented to person, place, and time.     Cranial Nerves: No cranial nerve deficit.  Psychiatric:        Behavior: Behavior normal.  Vitals signs reviewed.     Assessment/Plan: No diagnosis found.  No orders of the defined types were placed in this encounter.            GYN counsel STD prevention, adequate intake of calcium and vitamin D, diet and exercise     F/U  No follow-ups on file.  Radley Teston B. Seaira Byus, PA-C 04/28/2019 3:30 PM

## 2019-04-29 ENCOUNTER — Ambulatory Visit: Payer: Medicaid Other | Admitting: Obstetrics and Gynecology

## 2019-05-17 ENCOUNTER — Emergency Department
Admission: EM | Admit: 2019-05-17 | Discharge: 2019-05-17 | Disposition: A | Discharge status: Home / Self Care | Payer: No Typology Code available for payment source | Attending: Emergency Medicine | Admitting: Emergency Medicine

## 2019-05-17 ENCOUNTER — Emergency Department: Payer: No Typology Code available for payment source

## 2019-05-17 ENCOUNTER — Other Ambulatory Visit: Payer: Self-pay

## 2019-05-17 ENCOUNTER — Encounter: Payer: Self-pay | Admitting: Emergency Medicine

## 2019-05-17 DIAGNOSIS — R109 Unspecified abdominal pain: Secondary | ICD-10-CM | POA: Insufficient documentation

## 2019-05-17 DIAGNOSIS — M542 Cervicalgia: Secondary | ICD-10-CM | POA: Diagnosis present

## 2019-05-17 DIAGNOSIS — Z79899 Other long term (current) drug therapy: Secondary | ICD-10-CM | POA: Insufficient documentation

## 2019-05-17 DIAGNOSIS — Y999 Unspecified external cause status: Secondary | ICD-10-CM | POA: Diagnosis not present

## 2019-05-17 DIAGNOSIS — Y9389 Activity, other specified: Secondary | ICD-10-CM | POA: Insufficient documentation

## 2019-05-17 LAB — CBC WITH DIFFERENTIAL/PLATELET
Abs Immature Granulocytes: 0.02 10*3/uL (ref 0.00–0.07)
Basophils Absolute: 0.1 10*3/uL (ref 0.0–0.1)
Basophils Relative: 1 %
Eosinophils Absolute: 0.2 10*3/uL (ref 0.0–0.5)
Eosinophils Relative: 2 %
HCT: 43.6 % (ref 36.0–46.0)
Hemoglobin: 14.3 g/dL (ref 12.0–15.0)
Immature Granulocytes: 0 %
Lymphocytes Relative: 16 %
Lymphs Abs: 1.6 10*3/uL (ref 0.7–4.0)
MCH: 29.2 pg (ref 26.0–34.0)
MCHC: 32.8 g/dL (ref 30.0–36.0)
MCV: 89 fL (ref 80.0–100.0)
Monocytes Absolute: 0.6 10*3/uL (ref 0.1–1.0)
Monocytes Relative: 6 %
Neutro Abs: 7.4 10*3/uL (ref 1.7–7.7)
Neutrophils Relative %: 75 %
Platelets: 306 10*3/uL (ref 150–400)
RBC: 4.9 MIL/uL (ref 3.87–5.11)
RDW: 12.4 % (ref 11.5–15.5)
WBC: 9.8 10*3/uL (ref 4.0–10.5)
nRBC: 0 % (ref 0.0–0.2)

## 2019-05-17 LAB — COMPREHENSIVE METABOLIC PANEL
ALT: 23 U/L (ref 0–44)
AST: 23 U/L (ref 15–41)
Albumin: 4.9 g/dL (ref 3.5–5.0)
Alkaline Phosphatase: 76 U/L (ref 38–126)
Anion gap: 10 (ref 5–15)
BUN: 11 mg/dL (ref 6–20)
CO2: 23 mmol/L (ref 22–32)
Calcium: 9.4 mg/dL (ref 8.9–10.3)
Chloride: 104 mmol/L (ref 98–111)
Creatinine, Ser: 0.56 mg/dL (ref 0.44–1.00)
GFR calc Af Amer: >60 mL/min (ref >60)
GFR calc non Af Amer: >60 mL/min (ref >60)
Glucose, Bld: 79 mg/dL (ref 70–99)
Potassium: 3.9 mmol/L (ref 3.5–5.1)
Sodium: 137 mmol/L (ref 135–145)
Total Bilirubin: 0.5 mg/dL (ref 0.3–1.2)
Total Protein: 8.1 g/dL (ref 6.5–8.1)

## 2019-05-17 LAB — URINALYSIS, COMPLETE (UACMP) WITH MICROSCOPIC
Bacteria, UA: NONE SEEN
Bilirubin Urine: NEGATIVE
Glucose, UA: NEGATIVE mg/dL
Hgb urine dipstick: NEGATIVE
Ketones, ur: NEGATIVE mg/dL
Leukocytes,Ua: NEGATIVE
Nitrite: NEGATIVE
Protein, ur: NEGATIVE mg/dL
Specific Gravity, Urine: 1.016 (ref 1.005–1.030)
pH: 5 (ref 5.0–8.0)

## 2019-05-17 LAB — POCT PREGNANCY, URINE: Preg Test, Ur: NEGATIVE

## 2019-05-17 MED ORDER — IOHEXOL 300 MG/ML  SOLN
75.0000 mL | Freq: Once | INTRAMUSCULAR | Status: AC | PRN
  Administered 2019-05-17: 75 mL via INTRAVENOUS
  Filled 2019-05-17: qty 75

## 2019-05-17 MED ORDER — METHOCARBAMOL 500 MG PO TABS: 500.0000 mg | ORAL_TABLET | Freq: Three times a day (TID) | ORAL | 0 refills | Status: AC | PRN
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
## 2019-05-17 NOTE — ED Provider Notes (Signed)
-----------------------------------------   4:59 PM on 05/17/2019 -----------------------------------------  EKG viewed and interpreted by myself shows a normal sinus rhythm at 62 bpm with a narrow QRS, normal axis, normal intervals, no concerning ST changes.   Harvest Dark, MD 05/17/19 1700
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
## 2021-06-27 ENCOUNTER — Telehealth: Payer: Medicaid Other

## 2021-06-27 NOTE — Telephone Encounter (Signed)
Pt's mom calling; pt is 22wks and spotting.  [phone]  Mom states pt is visiting from [place]; had some spotting one time with wiping today; adv not to worry about it since it happened one time; if becomes like a period to go to ED.  Mom appreciative.

## 2021-07-03 ENCOUNTER — Other Ambulatory Visit: Payer: Self-pay

## 2021-07-03 ENCOUNTER — Observation Stay
Admission: EM | Admit: 2021-07-03 | Discharge: 2021-07-03 | Disposition: A | Discharge status: Home / Self Care | Payer: Medicaid Other | Attending: Obstetrics and Gynecology | Admitting: Obstetrics and Gynecology

## 2021-07-03 ENCOUNTER — Encounter: Payer: Self-pay | Admitting: *Deleted

## 2021-07-03 ENCOUNTER — Observation Stay: Payer: Medicaid Other

## 2021-07-03 DIAGNOSIS — Z3A22 22 weeks gestation of pregnancy: Secondary | ICD-10-CM | POA: Insufficient documentation

## 2021-07-03 DIAGNOSIS — O4692 Antepartum hemorrhage, unspecified, second trimester: Principal | ICD-10-CM | POA: Insufficient documentation

## 2021-07-03 LAB — CBC
HCT: 33.2 % — ABNORMAL LOW (ref 36.0–46.0)
Hemoglobin: 11.3 g/dL — ABNORMAL LOW (ref 12.0–15.0)
MCH: 31.6 pg (ref 26.0–34.0)
MCHC: 34 g/dL (ref 30.0–36.0)
MCV: 92.7 fL (ref 80.0–100.0)
Platelets: 261 10*3/uL (ref 150–400)
RBC: 3.58 MIL/uL — ABNORMAL LOW (ref 3.87–5.11)
RDW: 13 % (ref 11.5–15.5)
WBC: 11.1 10*3/uL — ABNORMAL HIGH (ref 4.0–10.5)
nRBC: 0 % (ref 0.0–0.2)

## 2021-07-03 LAB — ABO/RH: ABO/RH(D): A POS

## 2021-07-03 MED ORDER — ACETAMINOPHEN 325 MG PO TABS: 650.0000 mg | ORAL_TABLET | ORAL | Status: DC | PRN
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
## 2021-07-03 NOTE — Discharge Summary (Signed)
Physician Final Progress Note  Patient ID: Stefanie Boone MRN: [id number] DOB/AGE: 07-02-2001 20 y.o.  Admit date: 07/03/2021 Admitting provider: Vena Austria, MD Discharge date: 07/03/2021   Admission Diagnoses: Second trimester bleeding  Discharge Diagnoses:  Active Problems:   Second trimester bleeding   20 y.o. G1P0000 at 22w3 by Estimated Date of Delivery: 11/03/21 who has received routine prenatal care in [place] this pregnancy but is visit her mother currently.  The patient reports noting some vaginal spotting early today.  She is not currently active bleeding.  Records are not available to review and she has not had her anatomy scan yet (last ultrasound around 12 weeks).  Formal ultrasound was obtained ruling out placenta previa.  Exam was performed showing no evidence of vaginal bleeding.  No evidence of hemerrhoids.  +FM, no LOF, no contractions.  KB was obtained an pending at time of discharge but concern for aburption low.   Blood pressure 100/63, pulse 89, temperature 98.8 F (37.1 C), temperature source Oral, resp. rate 16.    Consults: None  Significant Findings/ Diagnostic Studies:  Results for orders placed or performed during the hospital encounter of 07/03/21 (from the past 24 hour(s))  CBC     Status: Abnormal   Collection Time: 07/03/21  8:15 PM  Result Value Ref Range   WBC 11.1 (H) 4.0 - 10.5 K/uL   RBC 3.58 (L) 3.87 - 5.11 MIL/uL   Hemoglobin 11.3 (L) 12.0 - 15.0 g/dL   HCT 63.1 (L) 49.7 - 02.6 %   MCV 92.7 80.0 - 100.0 fL   MCH 31.6 26.0 - 34.0 pg   MCHC 34.0 30.0 - 36.0 g/dL   RDW 37.8 58.8 - 50.2 %   Platelets 261 150 - 400 K/uL   nRBC 0.0 0.0 - 0.2 %    US OB Limited  Result Date: 07/03/2021 CLINICAL DATA:  Second trimester vaginal bleeding. EXAM: LIMITED OBSTETRIC ULTRASOUND COMPARISON:  None. FINDINGS: Number of Fetuses: 1 Heart Rate:  140 bpm Movement: Present Presentation: Breech Placental Location: Anterior Previa: No Amniotic Fluid  (Subjective):  Within normal limits. AFI: 5.2 cm BPD: 5.3 cm 22 w  1 d MATERNAL FINDINGS: Cervix:  Appears closed. Uterus/Adnexae: No abnormality visualized. IMPRESSION: Single living intrauterine fetus estimated at 22 weeks and 1 day gestation. Normal appearance of the placenta normal amniotic fluid volume. This exam is performed on an emergent basis and does not comprehensively evaluate fetal size, dating, or anatomy; follow-up complete OB US should be considered if further fetal assessment is warranted. Electronically Signed   By: Rudie Meyer M.D.   On: 07/03/2021 21:25     Procedures:  NST Baseline: 145 Variability: moderate Accelerations: absent Decelerations: absent Tocometry: none Appropriate tracing for gestational age   Discharge Condition: good  Disposition: Discharge disposition: 01-Home or Self Care       Diet: Regular diet  Discharge Activity: Activity as tolerated  Discharge Instructions     Discharge activity:  No Restrictions   Complete by: As directed    Discharge diet:  No restrictions   Complete by: As directed    No sexual activity restrictions   Complete by: As directed    Notify physician for a general feeling that "something is not right"   Complete by: As directed    Notify physician for increase or change in vaginal discharge   Complete by: As directed    Notify physician for intestinal cramps, with or without diarrhea, sometimes described as "gas pain"  Complete by: As directed    Notify physician for leaking of fluid   Complete by: As directed    Notify physician for low, dull backache, unrelieved by heat or Tylenol   Complete by: As directed    Notify physician for menstrual like cramps   Complete by: As directed    Notify physician for pelvic pressure   Complete by: As directed    Notify physician for uterine contractions.  These may be painless and feel like the uterus is tightening or the baby is  "balling up"   Complete by: As  directed    Notify physician for vaginal bleeding   Complete by: As directed    PRETERM LABOR:  Includes any of the follwing symptoms that occur between 20 - [redacted] weeks gestation.  If these symptoms are not stopped, preterm labor can result in preterm delivery, placing your baby at risk   Complete by: As directed       Allergies as of 07/03/2021   No Known Allergies      Medication List     STOP taking these medications    Norethindrone Acetate-Ethinyl Estrad-FE 1-20 MG-MCG(24) tablet Commonly known as: Blisovi 24 Fe        Follow-up Information     Vena Austria, MD .   Specialty: Obstetrics and Gynecology Contact information: [address] [place] [address] [phone]                 Total time spent taking care of this patient: 30 minutes  Signed: Vena Austria 07/03/2021, 11:30 PM

## 2021-07-04 LAB — KLEIHAUER-BETKE STAIN
Fetal Cells %: 0 %
Quantitation Fetal Hemoglobin: 0 mL

## 2022-07-23 IMAGING — US US OB LIMITED
1 series · 1 of 1 positions shown · non-contrast
Comparison: None.

CLINICAL DATA: Second trimester vaginal bleeding.

EXAM:
LIMITED OBSTETRIC ULTRASOUND

[Series 1001: ob us · 1 of 1 slices shown]
[im 1/1]
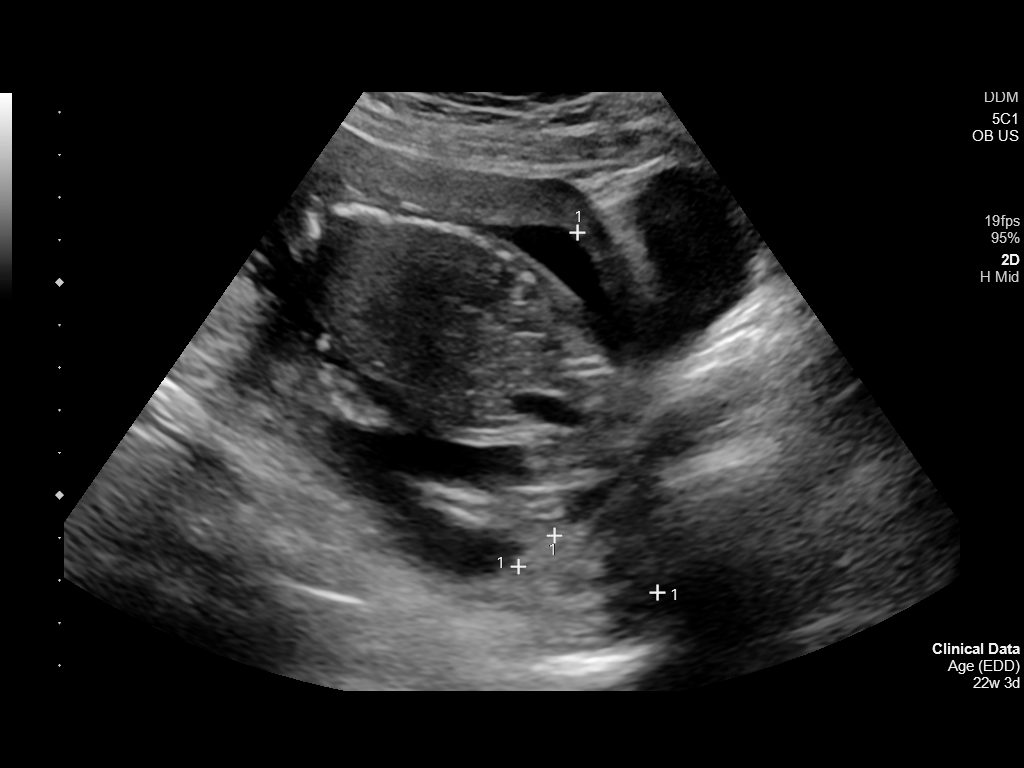

[1 of 1 positions shown; findings below may reference images not displayed]

FINDINGS: Number of Fetuses: 1

Heart Rate:  140 bpm

Movement: Present

Presentation: Breech

Placental Location: Anterior

Previa: No

Amniotic Fluid (Subjective):  Within normal limits.

AFI: 5.2 cm

BPD: 5.3 cm 22 w  1 d

MATERNAL FINDINGS:

Cervix:  Appears closed.

Uterus/Adnexae: No abnormality visualized.
IMPRESSION: Single living intrauterine fetus estimated at 22 weeks and 1 day
gestation.

Normal appearance of the placenta normal amniotic fluid volume.

This exam is performed on an emergent basis and does not
comprehensively evaluate fetal size, dating, or anatomy; follow-up
complete OB US should be considered if further fetal assessment is
warranted.
